# Patient Record
Sex: Female | Born: 1975 | Race: White | Hispanic: No | Marital: Married | Smoking: Never smoker
Health system: Southern US, Community
[De-identification: ages and names within clinical notes are randomized; demographics above are authoritative.]

## PROBLEM LIST (undated history)

## (undated) HISTORY — PX: APPENDECTOMY: SHX54

---

## 2001-01-10 ENCOUNTER — Encounter: Payer: Self-pay | Admitting: Internal Medicine

## 2001-01-10 ENCOUNTER — Encounter: Admission: RE | Admit: 2001-01-10 | Discharge: 2001-01-10 | Payer: Self-pay | Admitting: Internal Medicine

## 2001-01-25 ENCOUNTER — Encounter: Payer: Self-pay | Admitting: Internal Medicine

## 2001-01-25 ENCOUNTER — Encounter: Admission: RE | Admit: 2001-01-25 | Discharge: 2001-01-25 | Payer: Self-pay | Admitting: Internal Medicine

## 2001-01-26 ENCOUNTER — Encounter: Admission: RE | Admit: 2001-01-26 | Discharge: 2001-01-26 | Payer: Self-pay | Admitting: Infectious Diseases

## 2001-01-26 ENCOUNTER — Encounter: Payer: Self-pay | Admitting: Internal Medicine

## 2001-01-26 ENCOUNTER — Encounter: Admission: RE | Admit: 2001-01-26 | Discharge: 2001-01-26 | Payer: Self-pay | Admitting: Internal Medicine

## 2015-01-21 ENCOUNTER — Other Ambulatory Visit: Payer: Self-pay | Admitting: Internal Medicine

## 2015-01-21 DIAGNOSIS — Z0000 Encounter for general adult medical examination without abnormal findings: Secondary | ICD-10-CM

## 2015-01-21 DIAGNOSIS — Z130 Encounter for screening for diseases of the blood and blood-forming organs and certain disorders involving the immune mechanism: Secondary | ICD-10-CM

## 2015-01-21 DIAGNOSIS — Z1321 Encounter for screening for nutritional disorder: Secondary | ICD-10-CM

## 2015-01-21 DIAGNOSIS — Z1329 Encounter for screening for other suspected endocrine disorder: Secondary | ICD-10-CM

## 2015-01-21 DIAGNOSIS — Z13220 Encounter for screening for lipoid disorders: Secondary | ICD-10-CM

## 2015-01-21 LAB — CBC WITH DIFFERENTIAL/PLATELET
Basophils Absolute: 0 10*3/uL (ref 0.0–0.1)
Basophils Relative: 0 % (ref 0–1)
Eosinophils Absolute: 0.1 10*3/uL (ref 0.0–0.7)
Eosinophils Relative: 1 % (ref 0–5)
HCT: 40.1 % (ref 36.0–46.0)
Hemoglobin: 13.2 g/dL (ref 12.0–15.0)
Lymphocytes Relative: 13 % (ref 12–46)
Lymphs Abs: 0.8 10*3/uL (ref 0.7–4.0)
MCH: 28.7 pg (ref 26.0–34.0)
MCHC: 32.9 g/dL (ref 30.0–36.0)
MCV: 87.2 fL (ref 78.0–100.0)
MPV: 10.6 fL (ref 8.6–12.4)
Monocytes Absolute: 0.4 10*3/uL (ref 0.1–1.0)
Monocytes Relative: 6 % (ref 3–12)
Neutro Abs: 5 10*3/uL (ref 1.7–7.7)
Neutrophils Relative %: 80 % — ABNORMAL HIGH (ref 43–77)
Platelets: 242 10*3/uL (ref 150–400)
RBC: 4.6 MIL/uL (ref 3.87–5.11)
RDW: 14.3 % (ref 11.5–15.5)
WBC: 6.2 10*3/uL (ref 4.0–10.5)

## 2015-01-21 LAB — COMPLETE METABOLIC PANEL WITH GFR
ALT: 11 U/L (ref 0–35)
AST: 15 U/L (ref 0–37)
Albumin: 4.5 g/dL (ref 3.5–5.2)
Alkaline Phosphatase: 51 U/L (ref 39–117)
BUN: 12 mg/dL (ref 6–23)
CO2: 23 mEq/L (ref 19–32)
Calcium: 8.4 mg/dL (ref 8.4–10.5)
Chloride: 107 mEq/L (ref 96–112)
Creat: 0.61 mg/dL (ref 0.50–1.10)
GFR, Est African American: >89 mL/min
GFR, Est Non African American: >89 mL/min
Glucose, Bld: 90 mg/dL (ref 70–99)
Potassium: 4.5 mEq/L (ref 3.5–5.3)
Sodium: 139 mEq/L (ref 135–145)
Total Bilirubin: 0.4 mg/dL (ref 0.2–1.2)
Total Protein: 6.7 g/dL (ref 6.0–8.3)

## 2015-01-21 LAB — LIPID PANEL
Cholesterol: 146 mg/dL (ref 0–200)
HDL: 53 mg/dL (ref >=46)
LDL Cholesterol: 70 mg/dL (ref 0–99)
Total CHOL/HDL Ratio: 2.8 Ratio
Triglycerides: 116 mg/dL (ref <150)
VLDL: 23 mg/dL (ref 0–40)

## 2015-01-21 LAB — TSH: TSH: 1.835 u[IU]/mL (ref 0.350–4.500)

## 2015-01-22 ENCOUNTER — Encounter: Payer: Self-pay | Admitting: Internal Medicine

## 2015-01-22 ENCOUNTER — Ambulatory Visit (INDEPENDENT_AMBULATORY_CARE_PROVIDER_SITE_OTHER): Payer: No Typology Code available for payment source | Admitting: Internal Medicine

## 2015-01-22 VITALS — BP 122/76 | HR 100 | Temp 98.7°F | Ht 67.0 in | Wt 139.0 lb

## 2015-01-22 DIAGNOSIS — Z8669 Personal history of other diseases of the nervous system and sense organs: Secondary | ICD-10-CM

## 2015-01-22 DIAGNOSIS — Z0000 Encounter for general adult medical examination without abnormal findings: Secondary | ICD-10-CM | POA: Diagnosis not present

## 2015-01-22 DIAGNOSIS — Z23 Encounter for immunization: Secondary | ICD-10-CM

## 2015-01-22 LAB — POCT URINALYSIS DIPSTICK
Bilirubin, UA: NEGATIVE
GLUCOSE UA: NEGATIVE
Ketones, UA: NEGATIVE
Leukocytes, UA: NEGATIVE
Nitrite, UA: NEGATIVE
Protein, UA: NEGATIVE
RBC UA: NEGATIVE
UROBILINOGEN UA: NEGATIVE
pH, UA: 6

## 2015-01-22 LAB — VITAMIN D 25 HYDROXY (VIT D DEFICIENCY, FRACTURES): Vit D, 25-Hydroxy: 35 ng/mL (ref 30–100)

## 2015-01-22 NOTE — Patient Instructions (Signed)
Lab work is within normal limits. Please call back if fatigue does not resolve.

## 2015-01-22 NOTE — Progress Notes (Signed)
   Subjective:    Patient ID: Jessica Jackson, female    DOB: 1976/02/27, 39 y.o.   MRN: [id number]  HPI  39 year old White Female in today for health maintenance exam. She has been fatigued recently and wanted to have her thyroid checked. Patient is daughter of Dr. and Mrs. Jeanella FlatteryJohn Arrington. Previously lived in [place] and has moved to [place]. GYN physician is Dr. Billy Coastaavon.  Past medical history: No known drug allergies. Appendectomy 1995. Remote history of fractured wrist. 2 pregnancies and no miscarriages. Is on oral contraceptives.  History of migraine headaches. Some issues with sleep.  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 Currently not working and staying with her children. Husband is a Development worker, international aidprivate yacht captain and travels to [place]. She does not smoke. Social alcohol consumption maybe once a month. Son age 545 and a daughter age 253  Family history: Mother with history of hypertension, hypothyroidism, fibromyalgia syndrome, migraine headaches. Father with history of diabetes and hypertension. 3 brothers in good health. One sister in good health.         Review of Systems  Constitutional: Positive for fatigue.  Neurological:       History of migraine headaches  Psychiatric/Behavioral:       Insomnia       Objective:   Physical Exam  Constitutional: She is oriented to person, place, and time. She appears well-developed and well-nourished. She appears distressed.  Fatigued  HENT:  Head: Normocephalic and atraumatic.  Right Ear: External ear normal.  Left Ear: External ear normal.  Mouth/Throat: Oropharynx is clear and moist. No oropharyngeal exudate.  Eyes: Conjunctivae and EOM are normal. Pupils are equal, round, and reactive to light. Right eye exhibits no discharge. Left eye exhibits no discharge. No scleral icterus.  Neck: Neck supple. No JVD present. No thyromegaly present.  Cardiovascular: Normal rate, regular rhythm, normal  heart sounds and intact distal pulses.   No murmur heard. Pulmonary/Chest: Effort normal and breath sounds normal. No respiratory distress. She has no wheezes. She has no rales. She exhibits no tenderness.  Breasts normal female without masses  Abdominal: Soft. Bowel sounds are normal. She exhibits no distension and no mass. There is no tenderness. There is no rebound and no guarding.  Genitourinary:  Deferred to GYN  Musculoskeletal: She exhibits no edema.  Lymphadenopathy:    She has no cervical adenopathy.  Neurological: She is alert and oriented to person, place, and time. She has normal reflexes. No cranial nerve deficit. Coordination normal.  Skin: Skin is warm and dry. No rash noted. She is not diaphoretic.  Psychiatric: She has a normal mood and affect. Her behavior is normal. Judgment and thought content normal.  Denies depression  Vitals reviewed.         Assessment & Plan:  Fatigue-lab work within normal limits  History of migraine headaches  Insomnia  Plan

## 2015-01-23 LAB — VITAMIN B12: VITAMIN B 12: 350 pg/mL (ref 211–911)

## 2015-03-28 ENCOUNTER — Encounter: Payer: Self-pay | Admitting: Internal Medicine

## 2015-03-28 ENCOUNTER — Ambulatory Visit (INDEPENDENT_AMBULATORY_CARE_PROVIDER_SITE_OTHER): Payer: No Typology Code available for payment source | Admitting: Internal Medicine

## 2015-03-28 VITALS — BP 120/78 | HR 98 | Temp 98.5°F | Wt 140.5 lb

## 2015-03-28 DIAGNOSIS — Z8669 Personal history of other diseases of the nervous system and sense organs: Secondary | ICD-10-CM | POA: Diagnosis not present

## 2015-03-28 DIAGNOSIS — G4700 Insomnia, unspecified: Secondary | ICD-10-CM | POA: Diagnosis not present

## 2015-03-28 MED ORDER — BUTALBITAL-APAP-CAFFEINE 50-325-40 MG PO TABS: ORAL_TABLET | ORAL | Status: AC

## 2015-03-28 MED ORDER — AMITRIPTYLINE HCL 10 MG PO TABS: 10.0000 mg | ORAL_TABLET | Freq: Every day | ORAL | Status: DC

## 2015-03-28 MED ORDER — SUMATRIPTAN SUCCINATE 100 MG PO TABS: ORAL_TABLET | ORAL | Status: DC

## 2015-03-28 NOTE — Patient Instructions (Signed)
Try amitriptyline 10 mg at bedtime for insomnia. Take Imitrex 100 mg at onset of migraine and use Fioricet if no relief in one hour.

## 2015-03-28 NOTE — Progress Notes (Signed)
   Subjective:    Patient ID: Elio Forget, female    DOB: September 25, 1975, 39 y.o.   MRN: [id number]  HPI Patient mentioned that initial visit that she had some issues with insomnia. Has been taking melatonin and Tylenol PM. Waking up at 2 AM and can't go back to sleep. Sometimes gets up and does some work in and is exhausted by the afternoon. Says she feels tired but just simply cannot go back to sleep. Says father has similar sleep disorder.  She also has a history of migraine headaches and has about 4 migraines a year. Used to have some Fioricet on hand but doesn't have that any longer. She is on oral contraception's. She's been having some difficulty getting that regulated with GYN physician. However, husband recently had vasectomy and she anticipates she will not be a little can't receptive much longer.    Review of Systems     Objective:   Physical Exam Spent 20 minutes speaking with her about sleep disorders and how to manage migraine headaches. She's never tried triptan medication for migraine.       Assessment & Plan:  Insomnia  Migraine headaches  Plan: Amitriptyline 10 mg at bedtime might be good to try. She may feel a little drowsy in the mornings but it will actually help prevent migraines and may help break her pattern of insomnia. She will try this for 2 or 3 weeks and let me know if it is working. I hesitate to try Xanax or Ambien due to rebound insomnia. She understands that. I have given her a refill on Fioricet to have as a rescue medication and it'll set of migraine she should try Imitrex 100 mg tablet.

## 2015-04-26 ENCOUNTER — Other Ambulatory Visit: Payer: Self-pay | Admitting: Internal Medicine

## 2015-04-26 NOTE — Telephone Encounter (Signed)
Verbal order by Dr. Lenord Fellers to refill Amitriptyline Hcl  #30 w/3 refills.  Spoke with Janie @ [employer] [phone].

## 2015-09-04 ENCOUNTER — Other Ambulatory Visit: Payer: Self-pay | Admitting: Internal Medicine

## 2016-04-19 ENCOUNTER — Other Ambulatory Visit: Payer: Self-pay | Admitting: Internal Medicine

## 2016-05-11 ENCOUNTER — Other Ambulatory Visit: Payer: Self-pay | Admitting: Internal Medicine

## 2016-05-11 DIAGNOSIS — Z0000 Encounter for general adult medical examination without abnormal findings: Secondary | ICD-10-CM

## 2016-05-11 DIAGNOSIS — G4700 Insomnia, unspecified: Secondary | ICD-10-CM

## 2016-05-11 DIAGNOSIS — Z8669 Personal history of other diseases of the nervous system and sense organs: Secondary | ICD-10-CM

## 2016-05-11 LAB — CBC WITH DIFFERENTIAL/PLATELET
Basophils Absolute: 55 cells/uL (ref 0–200)
Basophils Relative: 1 %
EOS PCT: 2 %
Eosinophils Absolute: 110 cells/uL (ref 15–500)
HCT: 36.3 % (ref 35.0–45.0)
HEMOGLOBIN: 12.1 g/dL (ref 11.7–15.5)
LYMPHS ABS: 1815 {cells}/uL (ref 850–3900)
Lymphocytes Relative: 33 %
MCH: 28.7 pg (ref 27.0–33.0)
MCHC: 33.3 g/dL (ref 32.0–36.0)
MCV: 86 fL (ref 80.0–100.0)
MPV: 10.1 fL (ref 7.5–12.5)
Monocytes Absolute: 330 cells/uL (ref 200–950)
Monocytes Relative: 6 %
NEUTROS PCT: 58 %
Neutro Abs: 3190 cells/uL (ref 1500–7800)
PLATELETS: 261 10*3/uL (ref 140–400)
RBC: 4.22 MIL/uL (ref 3.80–5.10)
RDW: 13.9 % (ref 11.0–15.0)
WBC: 5.5 10*3/uL (ref 3.8–10.8)

## 2016-05-11 LAB — COMPLETE METABOLIC PANEL WITH GFR
ALT: 9 U/L (ref 6–29)
AST: 12 U/L (ref 10–30)
Albumin: 4.1 g/dL (ref 3.6–5.1)
Alkaline Phosphatase: 49 U/L (ref 33–115)
BUN: 8 mg/dL (ref 7–25)
CHLORIDE: 104 mmol/L (ref 98–110)
CO2: 26 mmol/L (ref 20–31)
Calcium: 8.9 mg/dL (ref 8.6–10.2)
Creat: 0.68 mg/dL (ref 0.50–1.10)
GFR, Est African American: >89 mL/min (ref >=60)
GFR, Est Non African American: >89 mL/min (ref >=60)
Glucose, Bld: 94 mg/dL (ref 65–99)
Potassium: 4.5 mmol/L (ref 3.5–5.3)
SODIUM: 137 mmol/L (ref 135–146)
Total Bilirubin: 0.3 mg/dL (ref 0.2–1.2)
Total Protein: 6.3 g/dL (ref 6.1–8.1)

## 2016-05-11 LAB — TSH: TSH: 1.08 m[IU]/L

## 2016-05-11 LAB — LIPID PANEL
CHOL/HDL RATIO: 2.9 ratio (ref <=5.0)
Cholesterol: 152 mg/dL (ref 125–200)
HDL: 52 mg/dL (ref >=46)
LDL Cholesterol: 79 mg/dL (ref <130)
Triglycerides: 107 mg/dL (ref <150)
VLDL: 21 mg/dL (ref <30)

## 2016-05-12 LAB — VITAMIN D 25 HYDROXY (VIT D DEFICIENCY, FRACTURES): Vit D, 25-Hydroxy: 34 ng/mL (ref 30–100)

## 2016-05-18 ENCOUNTER — Other Ambulatory Visit: Payer: Self-pay | Admitting: Internal Medicine

## 2016-05-19 ENCOUNTER — Ambulatory Visit (INDEPENDENT_AMBULATORY_CARE_PROVIDER_SITE_OTHER): Payer: No Typology Code available for payment source | Admitting: Internal Medicine

## 2016-05-19 ENCOUNTER — Encounter: Payer: Self-pay | Admitting: Internal Medicine

## 2016-05-19 VITALS — BP 120/76 | HR 98 | Temp 97.8°F | Wt 142.5 lb

## 2016-05-19 DIAGNOSIS — Z8669 Personal history of other diseases of the nervous system and sense organs: Secondary | ICD-10-CM

## 2016-05-19 DIAGNOSIS — Z23 Encounter for immunization: Secondary | ICD-10-CM | POA: Diagnosis not present

## 2016-05-19 DIAGNOSIS — Z0000 Encounter for general adult medical examination without abnormal findings: Secondary | ICD-10-CM

## 2016-05-19 DIAGNOSIS — G4700 Insomnia, unspecified: Secondary | ICD-10-CM

## 2016-05-19 LAB — POCT URINALYSIS DIPSTICK
Bilirubin, UA: NEGATIVE
GLUCOSE UA: NEGATIVE
KETONES UA: NEGATIVE
Leukocytes, UA: NEGATIVE
Nitrite, UA: NEGATIVE
PROTEIN UA: NEGATIVE
UROBILINOGEN UA: NEGATIVE
pH, UA: 6

## 2016-05-19 MED ORDER — AMITRIPTYLINE HCL 10 MG PO TABS: ORAL_TABLET | ORAL | 5 refills | Status: DC

## 2016-05-19 NOTE — Progress Notes (Signed)
   Subjective:    Patient ID: Jessica Jackson, female    DOB: May 13, 1976, 40 y.o.   MRN: [id number]  HPI   40 year old Female for health maintenance exam and evaluation of medical issues.History of insomnia and migraine headaches. Insomnia improved with Elavil. Has not had many headaches this past year and actually has some leftover migraine medication. Asking if she could increase Elavil just a bit to help with better sleep.  Flu vaccine given.   Past medical history: No known drug allergies. Appendectomy 1995. History of fractured wrist in the remote past. 2 pregnancies and no miscarriages. Is on oral contraceptives.  GYN physician is Dr. table on. Patient is daughter of Dr. and Mrs. Jeanella FlatteryJohn Arrington. She previously lived in [place].  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 Husband is a Development worker, international aidprivate yacht captain and travels to [place] frequently. She does not smoke. Social alcohol consumption maybe once a month. Has a son and a daughter.  Family history: Mother with history of hypertension, hypothyroidism, fibromyalgia syndrome, migraine headaches. Father with history of diabetes and hypertension. 3 brothers in good health. One sister in good health.     Review of Systems history of migraine headaches and insomnia otherwise negative     Objective:   Physical Exam  Constitutional: She is oriented to person, place, and time. She appears well-developed and well-nourished. No distress.  HENT:  Head: Normocephalic and atraumatic.  Right Ear: External ear normal.  Left Ear: External ear normal.  Mouth/Throat: Oropharynx is clear and moist. No oropharyngeal exudate.  Eyes: Conjunctivae and EOM are normal. Pupils are equal, round, and reactive to light. Right eye exhibits no discharge. Left eye exhibits no discharge. No scleral icterus.  Neck: Neck supple. No JVD present. No thyromegaly present.  Cardiovascular: Normal rate, regular rhythm and normal heart sounds.    No murmur heard. Pulmonary/Chest: Effort normal and breath sounds normal. She has no wheezes.  Abdominal: Soft. Bowel sounds are normal. She exhibits no distension and no mass. There is no tenderness. There is no rebound and no guarding.  Genitourinary:  Genitourinary Comments: Deferred to GYN  Musculoskeletal: She exhibits no edema.  Lymphadenopathy:    She has no cervical adenopathy.  Neurological: She is alert and oriented to person, place, and time. She has normal reflexes. No cranial nerve deficit. Coordination normal.  Skin: Skin is warm and dry. No rash noted. She is not diaphoretic.  Psychiatric: She has a normal mood and affect. Her behavior is normal. Judgment and thought content normal.  Vitals reviewed.         Assessment & Plan:  Insomnia-increase Elavil to 20 mg at bedtime. This helps with migraine prevention as well as insomnia  Migraine headaches-continue when necessary triptan medication. Number of headaches has improved this past year

## 2016-05-20 NOTE — Patient Instructions (Signed)
It was a pleasure to see you today. Continue same medications. Increase amitriptyline to 20 mg from 10 mg at bedtime. Return in one year or as needed.

## 2016-12-05 ENCOUNTER — Other Ambulatory Visit: Payer: Self-pay | Admitting: Internal Medicine

## 2016-12-07 NOTE — Telephone Encounter (Signed)
Please call patient. Needs physical exam appointment after September 17. Okay to refill medication until CPE appointment after appointment is made.

## 2016-12-08 NOTE — Telephone Encounter (Signed)
Left message patient to call our office to schedule a Physical before we can refill this rx.

## 2017-05-13 ENCOUNTER — Other Ambulatory Visit: Payer: No Typology Code available for payment source | Admitting: Internal Medicine

## 2017-05-13 DIAGNOSIS — G4700 Insomnia, unspecified: Secondary | ICD-10-CM

## 2017-05-13 DIAGNOSIS — Z0000 Encounter for general adult medical examination without abnormal findings: Secondary | ICD-10-CM

## 2017-05-13 DIAGNOSIS — Z1321 Encounter for screening for nutritional disorder: Secondary | ICD-10-CM

## 2017-05-13 DIAGNOSIS — Z1329 Encounter for screening for other suspected endocrine disorder: Secondary | ICD-10-CM

## 2017-05-13 DIAGNOSIS — Z13220 Encounter for screening for lipoid disorders: Secondary | ICD-10-CM

## 2017-05-13 DIAGNOSIS — Z8669 Personal history of other diseases of the nervous system and sense organs: Secondary | ICD-10-CM

## 2017-05-14 LAB — COMPLETE METABOLIC PANEL WITH GFR
AG Ratio: 2.1 (calc) (ref 1.0–2.5)
ALT: 10 U/L (ref 6–29)
AST: 11 U/L (ref 10–30)
Albumin: 4.6 g/dL (ref 3.6–5.1)
Alkaline phosphatase (APISO): 64 U/L (ref 33–115)
BUN: 16 mg/dL (ref 7–25)
CALCIUM: 9.1 mg/dL (ref 8.6–10.2)
CO2: 25 mmol/L (ref 20–32)
CREATININE: 0.72 mg/dL (ref 0.50–1.10)
Chloride: 104 mmol/L (ref 98–110)
GFR, EST NON AFRICAN AMERICAN: 105 mL/min/{1.73_m2} (ref >=60)
GFR, Est African American: 122 mL/min/{1.73_m2} (ref >=60)
GLOBULIN: 2.2 g/dL (ref 1.9–3.7)
Glucose, Bld: 93 mg/dL (ref 65–99)
Potassium: 5 mmol/L (ref 3.5–5.3)
SODIUM: 138 mmol/L (ref 135–146)
Total Bilirubin: 0.3 mg/dL (ref 0.2–1.2)
Total Protein: 6.8 g/dL (ref 6.1–8.1)

## 2017-05-14 LAB — CBC WITH DIFFERENTIAL/PLATELET
Basophils Absolute: 42 cells/uL (ref 0–200)
Basophils Relative: 0.7 %
EOS PCT: 1 %
Eosinophils Absolute: 60 cells/uL (ref 15–500)
HEMATOCRIT: 37.2 % (ref 35.0–45.0)
Hemoglobin: 12.5 g/dL (ref 11.7–15.5)
LYMPHS ABS: 1572 {cells}/uL (ref 850–3900)
MCH: 28.9 pg (ref 27.0–33.0)
MCHC: 33.6 g/dL (ref 32.0–36.0)
MCV: 86.1 fL (ref 80.0–100.0)
MPV: 10.2 fL (ref 7.5–12.5)
Monocytes Relative: 5.5 %
NEUTROS ABS: 3996 {cells}/uL (ref 1500–7800)
NEUTROS PCT: 66.6 %
Platelets: 285 10*3/uL (ref 140–400)
RBC: 4.32 10*6/uL (ref 3.80–5.10)
RDW: 12.7 % (ref 11.0–15.0)
Total Lymphocyte: 26.2 %
WBC mixed population: 330 cells/uL (ref 200–950)
WBC: 6 10*3/uL (ref 3.8–10.8)

## 2017-05-14 LAB — LIPID PANEL
Cholesterol: 192 mg/dL (ref <200)
HDL: 75 mg/dL (ref >50)
LDL Cholesterol (Calc): 103 mg/dL (calc) — ABNORMAL HIGH
NON-HDL CHOLESTEROL (CALC): 117 mg/dL (ref <130)
TRIGLYCERIDES: 59 mg/dL (ref <150)
Total CHOL/HDL Ratio: 2.6 (calc) (ref <5.0)

## 2017-05-14 LAB — VITAMIN D 25 HYDROXY (VIT D DEFICIENCY, FRACTURES): VIT D 25 HYDROXY: 40 ng/mL (ref 30–100)

## 2017-05-14 LAB — TSH: TSH: 1.05 mIU/L

## 2017-05-20 ENCOUNTER — Encounter: Payer: Self-pay | Admitting: Internal Medicine

## 2017-05-20 ENCOUNTER — Ambulatory Visit (INDEPENDENT_AMBULATORY_CARE_PROVIDER_SITE_OTHER): Payer: No Typology Code available for payment source | Admitting: Internal Medicine

## 2017-05-20 VITALS — BP 110/64 | HR 89 | Temp 98.7°F | Ht 67.0 in | Wt 155.0 lb

## 2017-05-20 DIAGNOSIS — Z23 Encounter for immunization: Secondary | ICD-10-CM | POA: Diagnosis not present

## 2017-05-20 DIAGNOSIS — Z8669 Personal history of other diseases of the nervous system and sense organs: Secondary | ICD-10-CM

## 2017-05-20 DIAGNOSIS — G4709 Other insomnia: Secondary | ICD-10-CM

## 2017-05-20 DIAGNOSIS — Z0000 Encounter for general adult medical examination without abnormal findings: Secondary | ICD-10-CM

## 2017-05-20 LAB — POCT URINALYSIS DIPSTICK
Bilirubin, UA: NEGATIVE
Glucose, UA: NEGATIVE
Ketones, UA: NEGATIVE
Leukocytes, UA: NEGATIVE
Nitrite, UA: NEGATIVE
PH UA: 7 (ref 5.0–8.0)
PROTEIN UA: NEGATIVE
RBC UA: NEGATIVE
SPEC GRAV UA: 1.01 (ref 1.010–1.025)
UROBILINOGEN UA: 0.2 U/dL

## 2017-05-20 MED ORDER — SUMATRIPTAN SUCCINATE 100 MG PO TABS: ORAL_TABLET | ORAL | 0 refills | Status: DC

## 2017-05-20 MED ORDER — AMITRIPTYLINE HCL 10 MG PO TABS: ORAL_TABLET | ORAL | 5 refills | Status: DC

## 2017-05-20 NOTE — Patient Instructions (Signed)
It was a pleasure to see you today. Continue same medications and return in one year or as needed. Work on diet exercise and weight loss. Flu vaccine given.

## 2017-05-20 NOTE — Progress Notes (Signed)
   Subjective:    Patient ID: Jessica Jackson, female    DOB: 10/30/1975, 41 y.o.   MRN: [id number]  HPI   41 year old Female for health maintenance exam and evaluation of medical issues.  Migraine headaches fairly well controlled on when necessary Imitrex. Says they have improved since insomnia has improved on Elavil.  She has gained from 142 pounds to 155 pounds. Needs to work on diet and exercise. Elavil can cause a bit of weight gain.  Flu vaccine given.  No known drug allergies. Appendectomy 1995. History of fractured wrist in the remote past. 2 pregnancies and no miscarriages. Is not on oral contraceptives- says husband had vasectomy.  GYN physician is Dr. Billy Coast.  Patient previously lived in [place]. She is a daughter of Dr. and Mrs. Jeanella Flattery.  Family history: Mother with history of hypertension, hypothyroidism, fibromyalgia syndrome, migraine headaches. Father with history of diabetes and hypertension. 3 brothers in good health. One sister in good health.  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 Husband is a Development worker, international aid and travels to [place] frequently. She does not smoke. Social alcohol consumption maybe once a month. 2 children one in kindergarten and one in the second grade, a son and a daughter.      Review of Systems in wintertime, toes turn slightly blue with cold weather. Recommend baby aspirin daily during those times. Likely has mild Raynaud's phenomenon     Objective:   Physical Exam  Constitutional: She is oriented to person, place, and time. She appears well-developed and well-nourished. No distress.  HENT:  Head: Normocephalic and atraumatic.  Right Ear: External ear normal.  Left Ear: External ear normal.  Mouth/Throat: Oropharynx is clear and moist. No oropharyngeal exudate.  Eyes: Pupils are equal, round, and reactive to light. Conjunctivae and EOM are  normal. Right eye exhibits no discharge. Left eye exhibits no discharge. No scleral icterus.  Neck: Neck supple. No JVD present. No thyromegaly present.  Cardiovascular: Normal rate, regular rhythm, normal heart sounds and intact distal pulses.   No murmur heard. Pulmonary/Chest: Effort normal and breath sounds normal. No respiratory distress. She has no wheezes. She has no rales.  Breasts normal female  Abdominal: Soft. Bowel sounds are normal. She exhibits no distension and no mass. There is no tenderness. There is no rebound and no guarding.  Genitourinary:  Genitourinary Comments: Deferred to GYN  Musculoskeletal: She exhibits no edema.  Lymphadenopathy:    She has no cervical adenopathy.  Neurological: She is alert and oriented to person, place, and time. She has normal reflexes. No cranial nerve deficit. Coordination normal.  Skin: Skin is dry. No rash noted. She is not diaphoretic.  Psychiatric: She has a normal mood and affect. Her behavior is normal. Judgment and thought content normal.  Vitals reviewed.         Assessment & Plan:  Weight gain-weight is increased from 142-155 pounds. Counsel on diet exercise and weight loss.  Insomnia-stable with Elavil 20 mg daily. Elavil can call some weight increase.  History of migraine headaches-improved with improvement with insomnia. Patient would like to have Imitrex on hand. Seldom gets migraine headaches anymore since sleep is improved.  Plan: Refill Imitrex and Elavil. Lab work reviewed with her is essentially within normal limits. Work on diet and exercise. Return one year or as needed.

## 2018-01-18 ENCOUNTER — Telehealth: Payer: Self-pay | Admitting: Internal Medicine

## 2018-01-18 MED ORDER — AMITRIPTYLINE HCL 10 MG PO TABS: ORAL_TABLET | ORAL | 0 refills | Status: DC

## 2018-01-18 NOTE — Telephone Encounter (Signed)
Jessica Jackson Self [id number]  [facility] - [place], [place]   amitriptyline (ELAVIL) 10 MG tablet     Marcel called to schedule her CPE and to also say she needs a refill on her amitriptyline (ELAVIL) 10 MG tablet

## 2018-01-18 NOTE — Telephone Encounter (Signed)
Refill Elavil x 90 days

## 2018-05-11 ENCOUNTER — Other Ambulatory Visit: Payer: Self-pay | Admitting: Internal Medicine

## 2018-05-11 DIAGNOSIS — E7800 Pure hypercholesterolemia, unspecified: Secondary | ICD-10-CM

## 2018-05-11 DIAGNOSIS — Z8669 Personal history of other diseases of the nervous system and sense organs: Secondary | ICD-10-CM

## 2018-05-11 DIAGNOSIS — G4709 Other insomnia: Secondary | ICD-10-CM

## 2018-05-11 DIAGNOSIS — Z0000 Encounter for general adult medical examination without abnormal findings: Secondary | ICD-10-CM

## 2018-05-26 ENCOUNTER — Ambulatory Visit (INDEPENDENT_AMBULATORY_CARE_PROVIDER_SITE_OTHER): Payer: No Typology Code available for payment source | Admitting: Internal Medicine

## 2018-05-26 ENCOUNTER — Encounter: Payer: Self-pay | Admitting: Internal Medicine

## 2018-05-26 VITALS — BP 120/82 | HR 98 | Ht 65.0 in | Wt 169.0 lb

## 2018-05-26 DIAGNOSIS — G4709 Other insomnia: Secondary | ICD-10-CM

## 2018-05-26 DIAGNOSIS — R635 Abnormal weight gain: Secondary | ICD-10-CM

## 2018-05-26 DIAGNOSIS — Z0000 Encounter for general adult medical examination without abnormal findings: Secondary | ICD-10-CM | POA: Diagnosis not present

## 2018-05-26 DIAGNOSIS — Z8669 Personal history of other diseases of the nervous system and sense organs: Secondary | ICD-10-CM | POA: Diagnosis not present

## 2018-05-26 LAB — POCT URINALYSIS DIPSTICK
APPEARANCE: NORMAL
Bilirubin, UA: NEGATIVE
Blood, UA: NEGATIVE
Glucose, UA: NEGATIVE
KETONES UA: NEGATIVE
LEUKOCYTES UA: NEGATIVE
NITRITE UA: NEGATIVE
Odor: NORMAL
PH UA: 6.5 (ref 5.0–8.0)
Protein, UA: NEGATIVE
Spec Grav, UA: 1.01 (ref 1.010–1.025)
UROBILINOGEN UA: 0.2 U/dL

## 2018-05-26 MED ORDER — AMITRIPTYLINE HCL 10 MG PO TABS: ORAL_TABLET | ORAL | 0 refills | Status: AC

## 2018-05-26 MED ORDER — SUMATRIPTAN SUCCINATE 100 MG PO TABS: ORAL_TABLET | ORAL | 0 refills | Status: AC

## 2018-05-26 NOTE — Patient Instructions (Addendum)
Migraine medication refilled.  Work on diet exercise and weight loss.  Return in a couple of weeks for Pap smear and pelvic exam deferred today due to menses.  Will get flu vaccine through employment.  Mammogram order provided.

## 2018-05-26 NOTE — Progress Notes (Signed)
Subjective:    Patient ID: Jessica Jackson, female    DOB: 1975/10/13, 42 y.o.   MRN: [id number]  HPI 42 year old Female for health maintenance exam and evaluation of medical issues.  History of migraine headaches.  Gets one about once a month.  Imitrex refilled.  Does take Elavil at night.  This helps with migraines and helps her sleep.  Time for screening mammogram.  Order placed.  Has gained more weight.  In 2016 weight 139 pounds, in 2017 weight 142 pounds, and 2018 weight 155 pounds and now weighs 169 pounds.  Not getting to exercise.  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  She has 2 children ages 33 and 42.  She is thinking about leaving work a bit earlier to try to get to the gym.  Works from home at night sometimes.  Sometimes is up late at night.  Is on menses today and will come back to have Pap smear and pelvic exam in a couple of weeks.  She will get flu vaccine through employment.  No known drug allergies.  Appendectomy 1995.  History of fractured wrist in the remote past.  2 pregnancies and no miscarriages.  Husband has had a vasectomy.  GYN physician is Dr. table on.  Patient previously lived in [place].  She is a daughter of Dr. and Mrs. Jeanella Flattery.  Family history: Mother with history of hypertension, hypothyroidism, fibromyalgia syndrome, migraine headaches.  Father with history of diabetes and hypertension.  3 brothers in good health.  One sister in good health.  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  Husband is a Development worker, international aid and travels to [place] frequently.  She does not smoke.  Social alcohol consumption maybe once a month.  2 children a son and a daughter ages 53 and 38.  History of toes turning blue and cold weather.  Likely has mild Raynaud's phenomena and recommended baby aspirin daily during wintertime.   Review of Systems  Constitutional: Negative.   Respiratory: Negative.   Cardiovascular:  Negative.   Gastrointestinal: Negative.   Genitourinary: Negative.   Neurological:       History of migraine headaches about once a month  Psychiatric/Behavioral: Negative.         Objective:   Physical Exam  Constitutional: She is oriented to person, place, and time. She appears well-developed and well-nourished. No distress.  HENT:  Head: Normocephalic and atraumatic.  Right Ear: External ear normal.  Left Ear: External ear normal.  Nose: Nose normal.  Mouth/Throat: Oropharynx is clear and moist. No oropharyngeal exudate.  Eyes: Pupils are equal, round, and reactive to light. Conjunctivae and EOM are normal. Right eye exhibits no discharge. Left eye exhibits no discharge. No scleral icterus.  Neck: Neck supple. No JVD present. No thyromegaly present.  Cardiovascular: Normal rate, regular rhythm, normal heart sounds and intact distal pulses.  No murmur heard. Pulmonary/Chest: Effort normal and breath sounds normal. No respiratory distress. She has no wheezes. She has no rales.  Abdominal: Soft. She exhibits no distension and no mass. There is no tenderness. There is no rebound and no guarding.  Genitourinary:  Genitourinary Comments: Deferred due to menses  Musculoskeletal: She exhibits no edema.  Lymphadenopathy:    She has no cervical adenopathy.  Neurological: She is alert and oriented to person, place, and time. She displays normal reflexes. No cranial nerve deficit. She exhibits normal muscle tone. Coordination normal.  Skin: Skin  is warm and dry. She is not diaphoretic.  Psychiatric: She has a normal mood and affect. Her behavior is normal. Thought content normal.  Vitals reviewed.         Assessment & Plan:  Normal health maintenance exam  To return in 2 weeks to have Pap smear and pelvic exam as is on menses today  To have flu vaccine through employment with Dr. Sudie Bailey  Weight gain-needs to work on diet exercise and weight loss  History of migraine headaches  stable with current treatment.  It is possible Elavil could be causing a little bit of weight gain but I think inactivity and calorie intake is more the cause of weight gain than perhaps  Elavil.

## 2018-06-23 ENCOUNTER — Ambulatory Visit
Admission: RE | Admit: 2018-06-23 | Discharge: 2018-06-23 | Disposition: A | Discharge status: Home / Self Care | Payer: No Typology Code available for payment source | Source: Ambulatory Visit | Attending: Internal Medicine | Admitting: Internal Medicine

## 2018-06-27 ENCOUNTER — Other Ambulatory Visit: Payer: Self-pay | Admitting: Internal Medicine

## 2018-06-27 DIAGNOSIS — R928 Other abnormal and inconclusive findings on diagnostic imaging of breast: Secondary | ICD-10-CM

## 2018-06-30 ENCOUNTER — Ambulatory Visit: Payer: No Typology Code available for payment source

## 2018-06-30 ENCOUNTER — Ambulatory Visit
Admission: RE | Admit: 2018-06-30 | Discharge: 2018-06-30 | Disposition: A | Discharge status: Home / Self Care | Payer: No Typology Code available for payment source | Source: Ambulatory Visit | Attending: Internal Medicine | Admitting: Internal Medicine

## 2018-06-30 DIAGNOSIS — R928 Other abnormal and inconclusive findings on diagnostic imaging of breast: Secondary | ICD-10-CM

## 2019-06-07 ENCOUNTER — Other Ambulatory Visit: Payer: Self-pay

## 2019-06-07 DIAGNOSIS — Z20822 Contact with and (suspected) exposure to covid-19: Secondary | ICD-10-CM

## 2019-06-09 LAB — NOVEL CORONAVIRUS, NAA: SARS-CoV-2, NAA: NOT DETECTED

## 2019-08-18 ENCOUNTER — Other Ambulatory Visit: Payer: Self-pay | Admitting: Internal Medicine

## 2019-08-18 DIAGNOSIS — Z1231 Encounter for screening mammogram for malignant neoplasm of breast: Secondary | ICD-10-CM

## 2019-10-05 ENCOUNTER — Ambulatory Visit
Admission: RE | Admit: 2019-10-05 | Discharge: 2019-10-05 | Disposition: A | Discharge status: Home / Self Care | Payer: BC Managed Care – PPO | Source: Ambulatory Visit | Attending: Internal Medicine | Admitting: Internal Medicine

## 2019-10-05 ENCOUNTER — Other Ambulatory Visit: Payer: Self-pay

## 2019-10-05 DIAGNOSIS — Z1231 Encounter for screening mammogram for malignant neoplasm of breast: Secondary | ICD-10-CM

## 2020-01-25 IMAGING — MG DIGITAL SCREENING BILATERAL MAMMOGRAM WITH CAD
4 series · 4 of 4 positions shown · non-contrast
Comparison: None

CLINICAL DATA: Screening.

EXAM:
DIGITAL SCREENING BILATERAL MAMMOGRAM WITH CAD

[R CC]
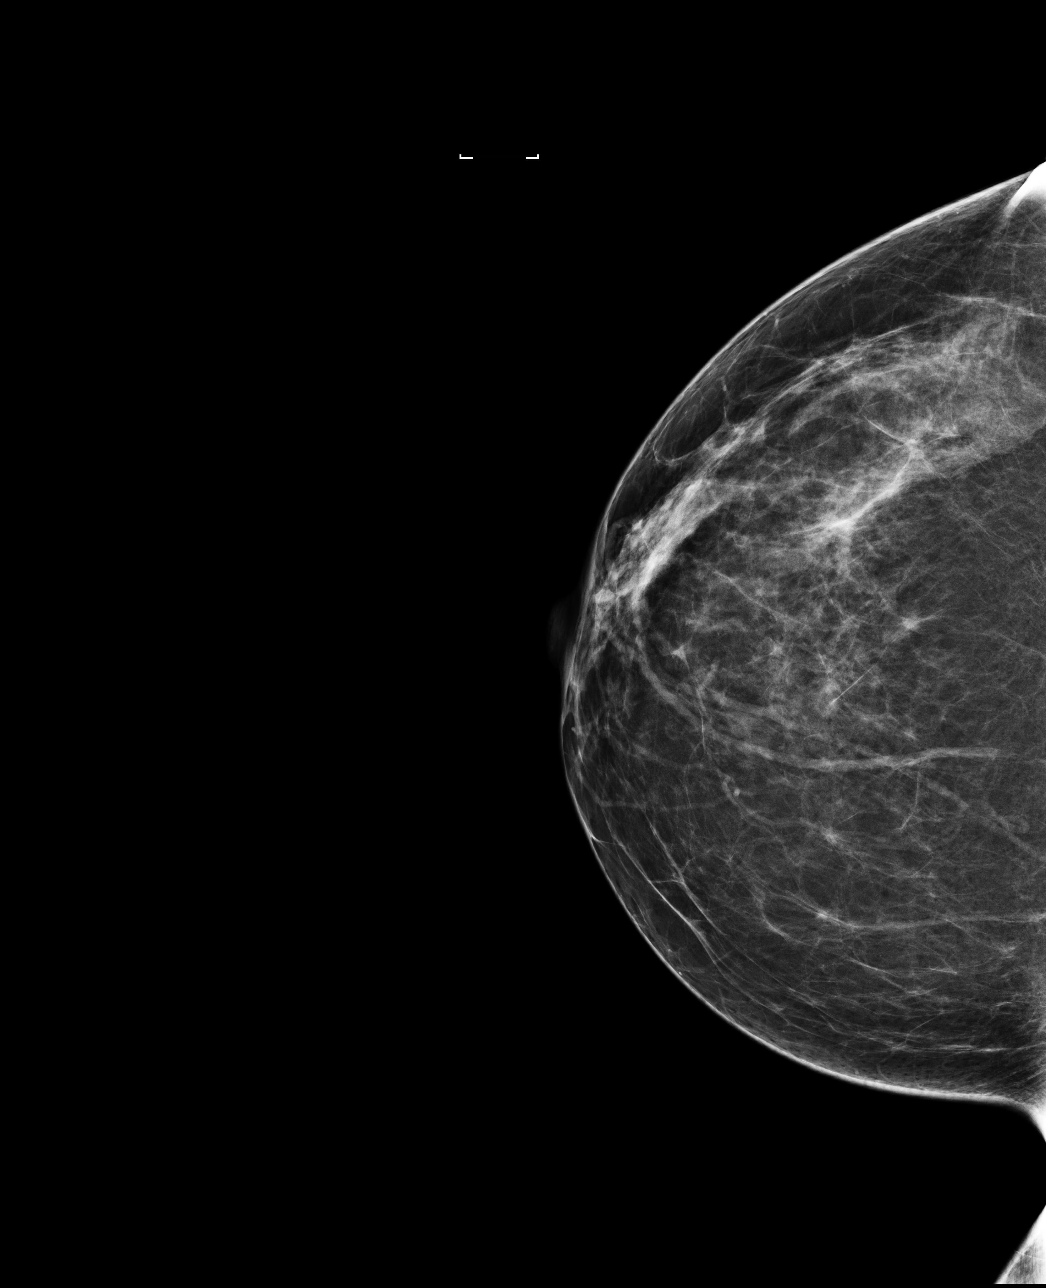

[R MLO]
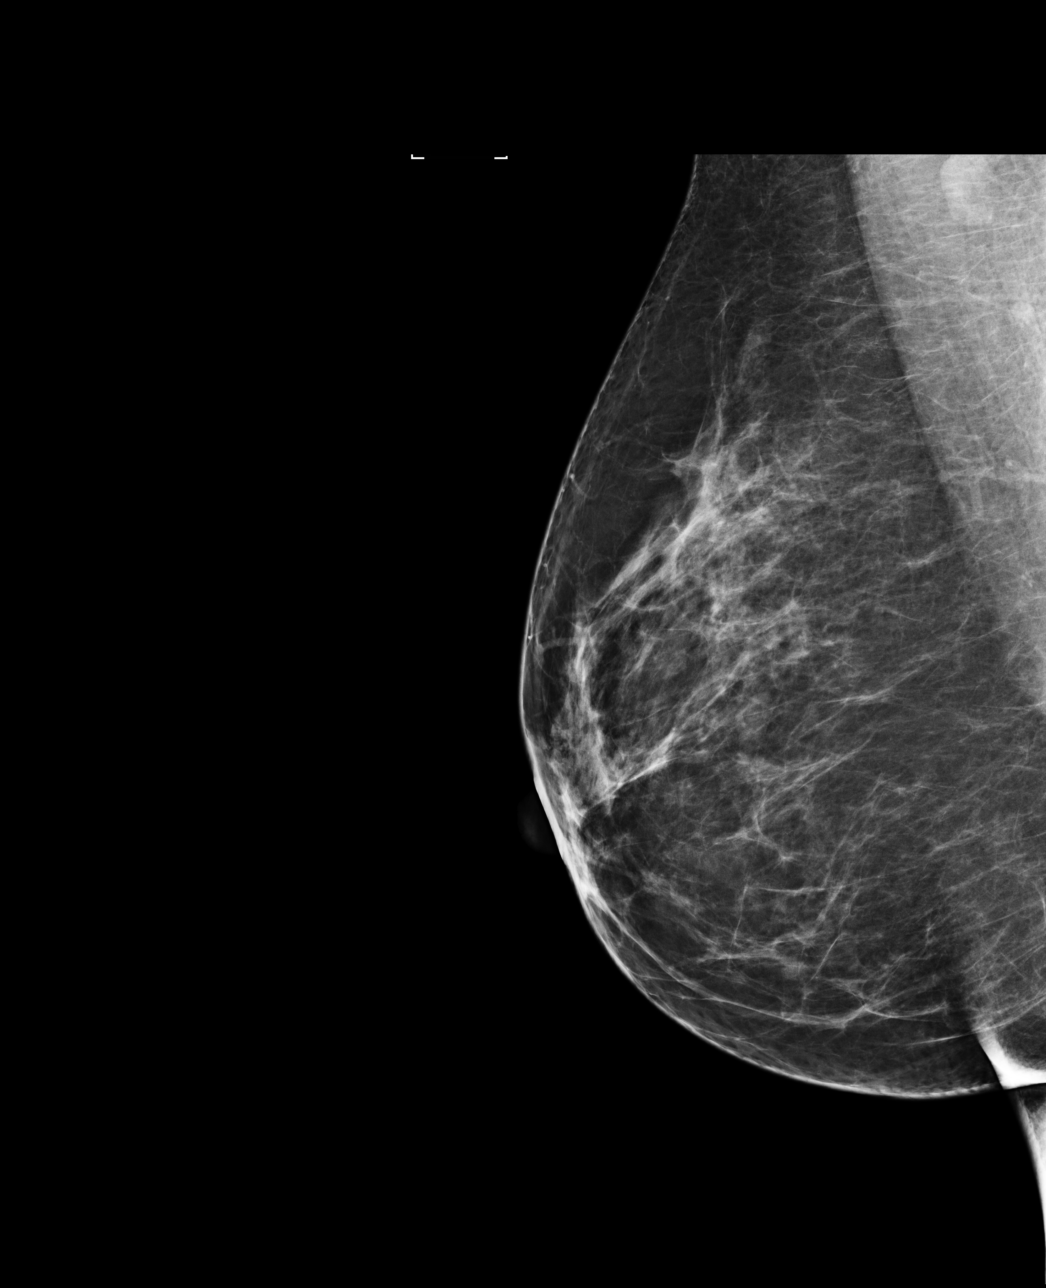

[L CC]
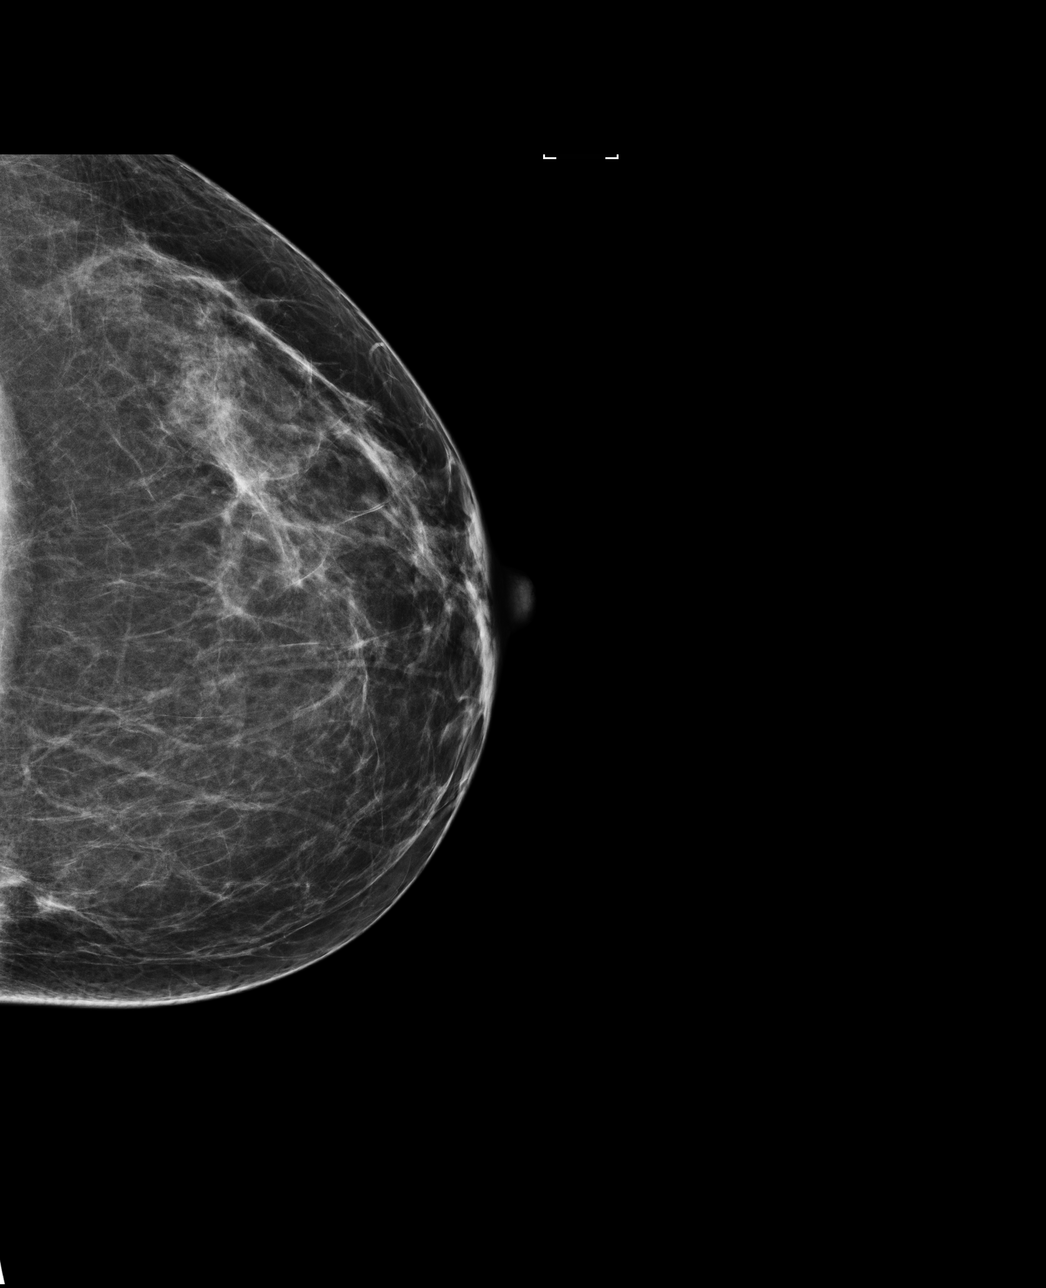

[L MLO]
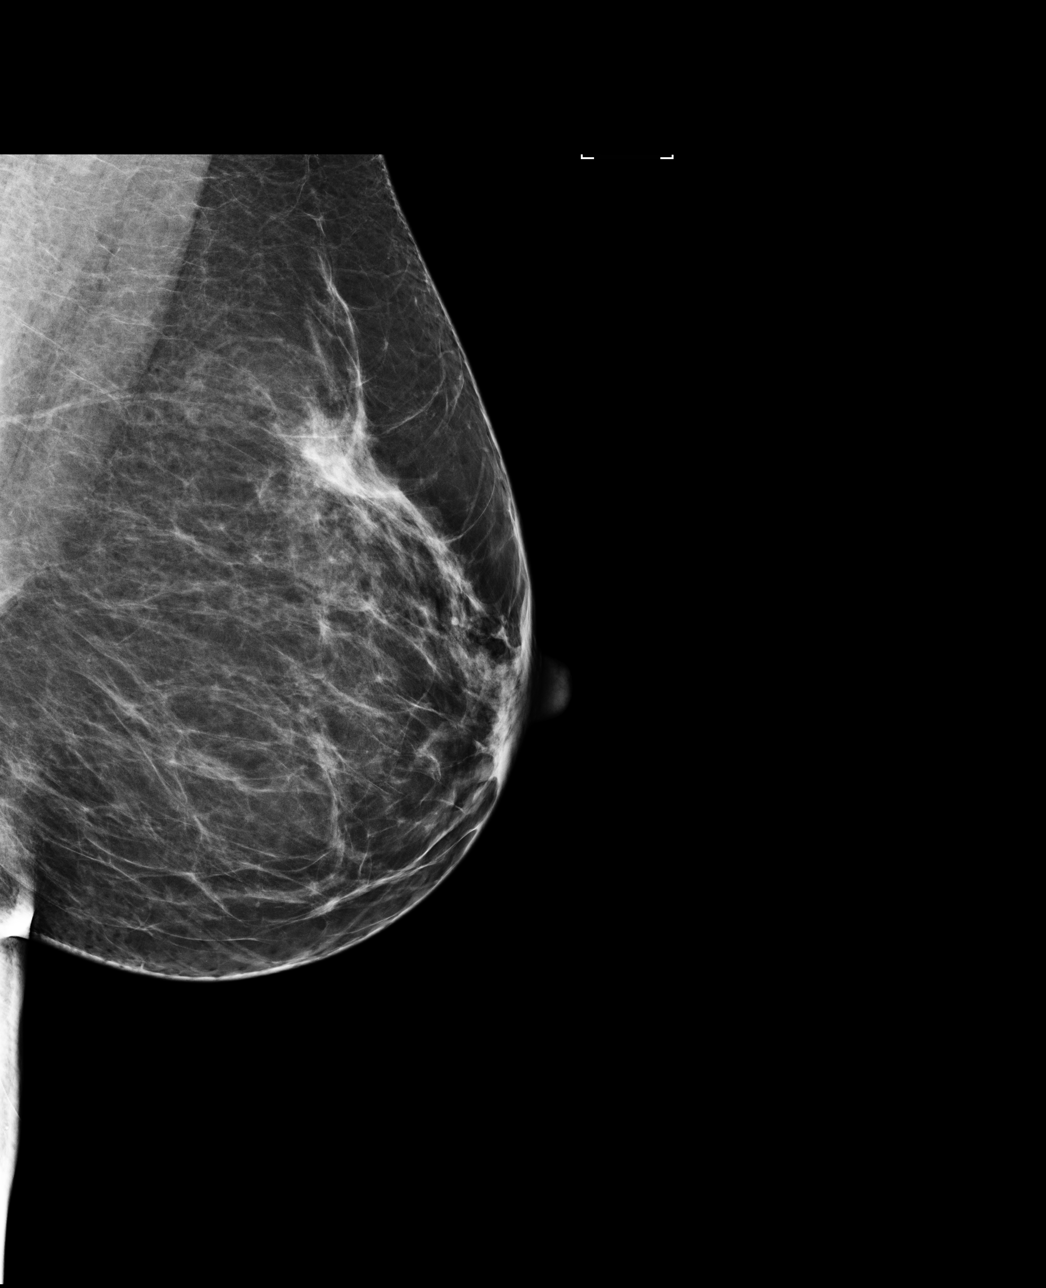

[4 of 4 positions shown; findings below may reference images not displayed]

ACR Breast Density Category b: There are scattered areas of
fibroglandular density.
FINDINGS: In the left breast, a possible asymmetry warrants further
evaluation. In the right breast, no findings suspicious for
malignancy. Images were processed with CAD.
IMPRESSION: Further evaluation is suggested for possible asymmetry in the left
breast.

RECOMMENDATION:
Diagnostic mammogram and possibly ultrasound of the left breast.
(Code:EG-[id number])

The patient will be contacted regarding the findings, and additional
imaging will be scheduled.

BI-RADS CATEGORY  0: Incomplete. Need additional imaging evaluation
and/or prior mammograms for comparison.

## 2020-11-18 ENCOUNTER — Other Ambulatory Visit: Payer: Self-pay | Admitting: Internal Medicine

## 2020-11-18 DIAGNOSIS — Z1231 Encounter for screening mammogram for malignant neoplasm of breast: Secondary | ICD-10-CM

## 2021-01-02 ENCOUNTER — Other Ambulatory Visit: Payer: Self-pay

## 2021-01-02 ENCOUNTER — Ambulatory Visit
Admission: RE | Admit: 2021-01-02 | Discharge: 2021-01-02 | Disposition: A | Discharge status: Home / Self Care | Payer: BC Managed Care – PPO | Source: Ambulatory Visit

## 2021-01-02 DIAGNOSIS — Z1231 Encounter for screening mammogram for malignant neoplasm of breast: Secondary | ICD-10-CM

## 2022-03-20 ENCOUNTER — Encounter (HOSPITAL_BASED_OUTPATIENT_CLINIC_OR_DEPARTMENT_OTHER): Payer: Self-pay | Admitting: Radiology

## 2022-03-20 ENCOUNTER — Ambulatory Visit (HOSPITAL_BASED_OUTPATIENT_CLINIC_OR_DEPARTMENT_OTHER)
Admission: RE | Admit: 2022-03-20 | Discharge: 2022-03-20 | Disposition: A | Discharge status: Home / Self Care | Payer: BC Managed Care – PPO | Source: Ambulatory Visit | Attending: Diagnostic Radiology | Admitting: Diagnostic Radiology

## 2022-03-20 DIAGNOSIS — Z1231 Encounter for screening mammogram for malignant neoplasm of breast: Secondary | ICD-10-CM | POA: Insufficient documentation

## 2023-06-19 ENCOUNTER — Ambulatory Visit (HOSPITAL_BASED_OUTPATIENT_CLINIC_OR_DEPARTMENT_OTHER)
Admission: RE | Admit: 2023-06-19 | Discharge: 2023-06-19 | Disposition: A | Discharge status: Home / Self Care | Payer: BC Managed Care – PPO | Source: Ambulatory Visit | Attending: Internal Medicine | Admitting: Internal Medicine

## 2023-06-19 DIAGNOSIS — Z1231 Encounter for screening mammogram for malignant neoplasm of breast: Secondary | ICD-10-CM | POA: Insufficient documentation

## 2024-08-31 ENCOUNTER — Other Ambulatory Visit (HOSPITAL_BASED_OUTPATIENT_CLINIC_OR_DEPARTMENT_OTHER): Payer: Self-pay | Admitting: Internal Medicine

## 2024-08-31 DIAGNOSIS — Z1231 Encounter for screening mammogram for malignant neoplasm of breast: Secondary | ICD-10-CM

## 2024-09-01 ENCOUNTER — Encounter (HOSPITAL_BASED_OUTPATIENT_CLINIC_OR_DEPARTMENT_OTHER): Payer: Self-pay | Admitting: Radiology

## 2024-09-01 ENCOUNTER — Ambulatory Visit (HOSPITAL_BASED_OUTPATIENT_CLINIC_OR_DEPARTMENT_OTHER)
Admission: RE | Admit: 2024-09-01 | Discharge: 2024-09-01 | Disposition: A | Discharge status: Home / Self Care | Source: Ambulatory Visit

## 2024-09-01 DIAGNOSIS — Z1231 Encounter for screening mammogram for malignant neoplasm of breast: Secondary | ICD-10-CM | POA: Diagnosis present
